# Patient Record
Sex: Male | Born: 1955 | Hispanic: Yes | Marital: Married | Smoking: Former smoker
Health system: Southern US, Community
[De-identification: ages and names within clinical notes are randomized; demographics above are authoritative.]

---

## 2007-09-25 ENCOUNTER — Ambulatory Visit: Payer: Self-pay | Admitting: Family Medicine

## 2009-12-05 ENCOUNTER — Emergency Department: Payer: Self-pay | Admitting: Emergency Medicine

## 2009-12-10 ENCOUNTER — Emergency Department: Payer: Self-pay | Admitting: Internal Medicine
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
## 2016-05-31 ENCOUNTER — Other Ambulatory Visit: Payer: Self-pay | Admitting: Physician Assistant

## 2016-05-31 DIAGNOSIS — R319 Hematuria, unspecified: Secondary | ICD-10-CM

## 2016-06-05 ENCOUNTER — Ambulatory Visit: Admission: RE | Admit: 2016-06-05 | Payer: BLUE CROSS/BLUE SHIELD | Source: Ambulatory Visit

## 2016-06-08 ENCOUNTER — Ambulatory Visit: Admission: RE | Admit: 2016-06-08 | Payer: BLUE CROSS/BLUE SHIELD | Source: Ambulatory Visit

## 2016-09-27 ENCOUNTER — Ambulatory Visit: Admission: RE | Admit: 2016-09-27 | Payer: BLUE CROSS/BLUE SHIELD | Source: Ambulatory Visit

## 2020-08-29 ENCOUNTER — Emergency Department
Admission: EM | Admit: 2020-08-29 | Discharge: 2020-08-29 | Disposition: A | Discharge status: Home / Self Care | Payer: No Typology Code available for payment source | Attending: Emergency Medicine | Admitting: Emergency Medicine

## 2020-08-29 ENCOUNTER — Other Ambulatory Visit: Payer: Self-pay

## 2020-08-29 ENCOUNTER — Encounter: Payer: Self-pay | Admitting: Emergency Medicine

## 2020-08-29 ENCOUNTER — Emergency Department: Payer: No Typology Code available for payment source

## 2020-08-29 DIAGNOSIS — S20229A Contusion of unspecified back wall of thorax, initial encounter: Secondary | ICD-10-CM

## 2020-08-29 DIAGNOSIS — Y92410 Unspecified street and highway as the place of occurrence of the external cause: Secondary | ICD-10-CM | POA: Diagnosis not present

## 2020-08-29 DIAGNOSIS — S300XXA Contusion of lower back and pelvis, initial encounter: Secondary | ICD-10-CM | POA: Insufficient documentation

## 2020-08-29 DIAGNOSIS — Z87891 Personal history of nicotine dependence: Secondary | ICD-10-CM | POA: Insufficient documentation

## 2020-08-29 DIAGNOSIS — S3992XA Unspecified injury of lower back, initial encounter: Secondary | ICD-10-CM | POA: Diagnosis present

## 2020-08-29 MED ORDER — NAPROXEN 375 MG PO TABS: 375.0000 mg | ORAL_TABLET | Freq: Two times a day (BID) | ORAL | 0 refills | Status: AC

## 2020-08-29 NOTE — ED Notes (Signed)
Per provider RS, pt thoroughly updated and educated about d/c while interpreter at bedside.
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
## 2024-06-10 ENCOUNTER — Encounter: Payer: Self-pay | Admitting: Urology

## 2024-06-10 ENCOUNTER — Ambulatory Visit: Payer: Self-pay | Admitting: Urology

## 2024-06-10 VITALS — BP 113/77 | HR 87 | Ht 65.0 in | Wt 127.0 lb

## 2024-06-10 DIAGNOSIS — R972 Elevated prostate specific antigen [PSA]: Secondary | ICD-10-CM

## 2024-06-10 NOTE — Progress Notes (Signed)
" ° °  06/10/2024 2:25 PM   Roberto Reed 1956-02-18 [id number]  Referring provider: Cecily Katz, PA-C [address] [place],  [place] [address]  Chief Complaint  Patient presents with   Elevated PSA    HPI: Roberto Reed is a 69 y.o. male referred for evaluation of an elevated PSA.  A [place] Spanish interpreter was present during today's visit  PSA levels: 6.7 on 03/26/2024; 5.9 on 05/11/2024 Baseline PSA level: No prior Lower urinary tract symptoms: Mild urinary urgency Family history prostate cancer first-degree relatives: Negative Past urologic history: Negative  PMH: No past medical history on file.  Surgical History: No past surgical history on file.  Home Medications:  Allergies as of 06/10/2024   No Known Allergies      Medication List        Accurate as of June 10, 2024  2:25 PM. If you have any questions, ask your nurse or doctor.          atorvastatin 20 MG tablet Commonly known as: LIPITOR Take 20 mg by mouth daily.   naproxen  375 MG tablet Commonly known as: NAPROSYN  Take 1 tablet (375 mg total) by mouth 2 (two) times daily with a meal.        Allergies: Allergies[1]  Family History: No family history on file.  Social History:  reports that he has quit smoking. He has never used smokeless tobacco. He reports current alcohol use. No history on file for drug use.   Physical Exam: BP 113/77   Pulse 87   Ht 5' 5 (1.651 m)   Wt 127 lb (57.6 kg)   BMI 21.13 kg/m   Constitutional:  Alert and oriented, No acute distress. HEENT: [place] AT Respiratory: Normal respiratory effort, no increased work of breathing. GU: Prostate 40 g, smooth without nodules Psychiatric: Normal mood and affect.   Assessment & Plan:    1.  Elevated PSA Although PSA is a prostate cancer screening test he was informed that cancer is not the most common cause of an elevated PSA. Other potential causes including BPH and inflammation  were discussed.  He was informed that the only way to adequately diagnose prostate cancer would be transrectal ultrasound and biopsy of the prostate. The procedure was discussed including potential risks of bleeding and infection/sepsis. He was also informed that a negative biopsy does not conclusively rule out the possibility that prostate cancer may be present and that continued monitoring is required.  The use of newer adjunctive blood and urine tests to predict the probability of high-grade prostate cancer were discussed. The use of multiparametric prostate MRI to evaluate for lesions suspicious for high grade prostate cancer and aid in targeted bx was reviewed.  Continued periodic surveillance was also discussed. PSA had decreased from initial level in October 2025 and is normal by age specific guidelines..  Recommended a PHI today.  If indicative of low prostate cancer risk will monitor.  Prostate MRI for PHI increased prostate cancer risk    Roberto JAYSON Barba, MD  [facility] [address], [address] [place], [place] [address] [phone]     [1] No Known Allergies  "

## 2024-06-12 ENCOUNTER — Ambulatory Visit: Payer: Self-pay | Admitting: Urology

## 2024-06-12 LAB — PROSTATE HEALTH INDEX: Prostate Specific Ag: 5.8 ng/mL — ABNORMAL HIGH (ref 0.0–3.9)

## 2024-06-12 LAB — PHI SCORE REFLEX
% Free PSA: 11.3 %
PSA, Free: 0.66 ng/mL
Prostate Heath Index Score: 41.6
p2PSA: 11.4 pg/mL

## 2024-06-15 ENCOUNTER — Other Ambulatory Visit: Payer: Self-pay | Admitting: *Deleted

## 2024-06-15 DIAGNOSIS — R972 Elevated prostate specific antigen [PSA]: Secondary | ICD-10-CM

## 2024-06-26 ENCOUNTER — Ambulatory Visit
Admission: RE | Admit: 2024-06-26 | Discharge: 2024-06-26 | Disposition: A | Payer: Self-pay | Source: Ambulatory Visit | Attending: Urology | Admitting: Urology

## 2024-06-26 DIAGNOSIS — R972 Elevated prostate specific antigen [PSA]: Secondary | ICD-10-CM | POA: Insufficient documentation

## 2024-06-26 MED ORDER — GADOBUTROL 1 MMOL/ML IV SOLN
6.0000 mL | Freq: Once | INTRAVENOUS | Status: AC | PRN
  Administered 2024-06-26: 6 mL via INTRAVENOUS

## 2024-06-30 ENCOUNTER — Ambulatory Visit: Payer: Self-pay | Admitting: Urology

## 2024-07-02 NOTE — Telephone Encounter (Signed)
 Patient and his wife came into the office this afternoon with Jacqui (interpreter), and she read Dr. Neale message in chart to patient. Patient decided he would like to have prostate biopsy. He wants to wait until his insurance is effective in March. Patient is scheduled for 08/28/24.

## 2024-08-28 ENCOUNTER — Other Ambulatory Visit: Payer: Self-pay | Admitting: Urology
